# Patient Record
Sex: Male | Born: 1970 | Race: Black or African American | Hispanic: No | Marital: Married | Smoking: Never smoker
Health system: Southern US, Community
[De-identification: ages and names within clinical notes are randomized; demographics above are authoritative.]

## PROBLEM LIST (undated history)

## (undated) DIAGNOSIS — I10 Essential (primary) hypertension: Secondary | ICD-10-CM

## (undated) DIAGNOSIS — R42 Dizziness and giddiness: Secondary | ICD-10-CM

---

## 2012-08-13 ENCOUNTER — Ambulatory Visit: Payer: Self-pay | Admitting: Sports Medicine

## 2012-09-12 ENCOUNTER — Emergency Department (INDEPENDENT_AMBULATORY_CARE_PROVIDER_SITE_OTHER)
Admission: EM | Admit: 2012-09-12 | Discharge: 2012-09-12 | Disposition: A | Discharge status: Home / Self Care | Payer: Self-pay | Source: Home / Self Care | Attending: Family Medicine | Admitting: Family Medicine

## 2012-09-12 ENCOUNTER — Encounter: Payer: Self-pay | Admitting: Emergency Medicine

## 2012-09-12 ENCOUNTER — Inpatient Hospital Stay (HOSPITAL_BASED_OUTPATIENT_CLINIC_OR_DEPARTMENT_OTHER): Admit: 2012-09-12 | Payer: Self-pay

## 2012-09-12 ENCOUNTER — Telehealth: Payer: Self-pay | Admitting: *Deleted

## 2012-09-12 DIAGNOSIS — R51 Headache: Secondary | ICD-10-CM

## 2012-09-12 HISTORY — DX: Essential (primary) hypertension: I10

## 2012-09-12 MED ORDER — DEXAMETHASONE SODIUM PHOSPHATE 10 MG/ML IJ SOLN
10.0000 mg | Freq: Once | INTRAMUSCULAR | Status: AC
  Administered 2012-09-12: 10 mg via INTRAMUSCULAR

## 2012-09-12 MED ORDER — PROMETHAZINE HCL 25 MG PO TABS: 25.0000 mg | ORAL_TABLET | Freq: Four times a day (QID) | ORAL | Status: DC | PRN

## 2012-09-12 MED ORDER — AMOXICILLIN 875 MG PO TABS: 875.0000 mg | ORAL_TABLET | Freq: Two times a day (BID) | ORAL | Status: DC

## 2012-09-12 NOTE — ED Notes (Signed)
Reports headache x 5-6 days; tried Sudafed for past 2 days thinking it might be sinus congestion, but did not improve. History of photosensitivity. Last eye exam 18 months ago (today 20/20 without correction bilat).

## 2012-09-12 NOTE — ED Provider Notes (Signed)
CSN: [id number]     Arrival date & time 09/12/12  1226 History   First MD Initiated Contact with Patient 09/12/12 1234     Chief Complaint  Patient presents with  . Headache    HPI  Patient presents today with chief complaint of headache x1 week. Patient states she's noticed headache over the past 67 days. Pressure seems to be more so sinus related per patient. Is fairly constant and is worse with sneezing. Nonexertional. No hemiparesis or confusion. Patient has had some mild photophobia and nausea. Patient denies any personal history of migraines in the past however does report a family history of this. No change in vision. Headache does not have occipital radiation. No nuchal rigidity or stiffness. No fevers or chills. Past Medical History  Diagnosis Date  . Hypertension     borderline; no meds   History reviewed. No pertinent past surgical history. Family History  Problem Relation Age of Onset  . Hypertension Mother   . Hypertension Sister    History  Substance Use Topics  . Smoking status: Never Smoker   . Smokeless tobacco: Not on file  . Alcohol Use: Yes    Review of Systems  All other systems reviewed and are negative.    Allergies  Review of patient's allergies indicates no known allergies.  Home Medications  No current outpatient prescriptions on file. BP 113/80  Pulse 86  Temp(Src) 98 F (36.7 C) (Oral)  Resp 16  Ht 5\' 9"  (1.753 m)  Wt 179 lb (81.194 kg)  BMI 26.42 kg/m2  SpO2 97% Physical Exam  Constitutional: He is oriented to person, place, and time. He appears well-developed and well-nourished.  HENT:  Head: Normocephalic and atraumatic.  Right Ear: External ear normal.  Positive frontal and maxillary sinus tenderness to palpation.  Eyes: Conjunctivae are normal. Pupils are equal, round, and reactive to light.  Minimal photophobia  Neck: Normal range of motion. Neck supple.  No meningismus Kernig and Brudzinski negative  Cardiovascular:  Normal rate and regular rhythm.   Pulmonary/Chest: Effort normal.  Abdominal: Soft.  Musculoskeletal: Normal range of motion.  Neurological: He is alert and oriented to person, place, and time. No cranial nerve deficit.  Skin: Skin is warm.    ED Course  Procedures (including critical care time) Labs Review Labs Reviewed - No data to display Imaging Review No results found.  MDM   1. Headache(784.0)    Differential diagnosis for headache include sinusitis induced headache versus new onset migrainous headache. Patient does meet criteria for neuroimaging getting new onset headache after the age of 65 as well as having pain as greater than 10 out of 10 intermittently. Will send patient for head CT without contrast to further evaluate brain intracranial abnormalities. Decadron 10 mg IM x1. We'll prescribe amoxicillin as well as Phenergan. Discuss infectious neuro red flags patient at length. Followup as needed.     The patient and/or caregiver has been counseled thoroughly with regard to treatment plan and/or medications prescribed including dosage, schedule, interactions, rationale for use, and possible side effects and they verbalize understanding. Diagnoses and expected course of recovery discussed and will return if not improved as expected or if the condition worsens. Patient and/or caregiver verbalized understanding.         Doree Albee, MD 09/12/12 [phone]

## 2016-02-27 ENCOUNTER — Emergency Department: Payer: 59

## 2016-02-27 ENCOUNTER — Emergency Department (INDEPENDENT_AMBULATORY_CARE_PROVIDER_SITE_OTHER): Payer: 59

## 2016-02-27 ENCOUNTER — Encounter: Payer: Self-pay | Admitting: *Deleted

## 2016-02-27 ENCOUNTER — Emergency Department (INDEPENDENT_AMBULATORY_CARE_PROVIDER_SITE_OTHER)
Admission: EM | Admit: 2016-02-27 | Discharge: 2016-02-27 | Disposition: A | Discharge status: Home / Self Care | Payer: 59 | Source: Home / Self Care | Attending: Family Medicine | Admitting: Family Medicine

## 2016-02-27 DIAGNOSIS — R42 Dizziness and giddiness: Secondary | ICD-10-CM

## 2016-02-27 DIAGNOSIS — W19XXXA Unspecified fall, initial encounter: Secondary | ICD-10-CM

## 2016-02-27 DIAGNOSIS — M542 Cervicalgia: Secondary | ICD-10-CM | POA: Diagnosis not present

## 2016-02-27 DIAGNOSIS — M5489 Other dorsalgia: Secondary | ICD-10-CM | POA: Diagnosis not present

## 2016-02-27 DIAGNOSIS — M79602 Pain in left arm: Secondary | ICD-10-CM

## 2016-02-27 DIAGNOSIS — S0990XA Unspecified injury of head, initial encounter: Secondary | ICD-10-CM | POA: Diagnosis not present

## 2016-02-27 DIAGNOSIS — R55 Syncope and collapse: Secondary | ICD-10-CM

## 2016-02-27 DIAGNOSIS — R200 Anesthesia of skin: Secondary | ICD-10-CM

## 2016-02-27 DIAGNOSIS — M5412 Radiculopathy, cervical region: Secondary | ICD-10-CM | POA: Diagnosis not present

## 2016-02-27 DIAGNOSIS — M5136 Other intervertebral disc degeneration, lumbar region: Secondary | ICD-10-CM

## 2016-02-27 DIAGNOSIS — M5137 Other intervertebral disc degeneration, lumbosacral region: Secondary | ICD-10-CM | POA: Diagnosis not present

## 2016-02-27 DIAGNOSIS — M5020 Other cervical disc displacement, unspecified cervical region: Secondary | ICD-10-CM | POA: Diagnosis not present

## 2016-02-27 DIAGNOSIS — M5030 Other cervical disc degeneration, unspecified cervical region: Secondary | ICD-10-CM

## 2016-02-27 DIAGNOSIS — IMO0002 Reserved for concepts with insufficient information to code with codable children: Secondary | ICD-10-CM

## 2016-02-27 HISTORY — DX: Dizziness and giddiness: R42

## 2016-02-27 LAB — POCT CBC W AUTO DIFF (K'VILLE URGENT CARE)

## 2016-02-27 LAB — POCT FASTING CBG KUC MANUAL ENTRY: POCT Glucose (KUC): 86 mg/dL (ref 70–99)

## 2016-02-27 MED ORDER — ACETAMINOPHEN 325 MG PO TABS
650.0000 mg | ORAL_TABLET | Freq: Once | ORAL | Status: AC
  Administered 2016-02-27: 650 mg via ORAL

## 2016-02-27 MED ORDER — CYCLOBENZAPRINE HCL 5 MG PO TABS: 5.0000 mg | ORAL_TABLET | Freq: Two times a day (BID) | ORAL | 0 refills | Status: DC | PRN

## 2016-02-27 MED ORDER — ONDANSETRON 4 MG PO TBDP
4.0000 mg | ORAL_TABLET | Freq: Once | ORAL | Status: AC
  Administered 2016-02-27: 4 mg via ORAL

## 2016-02-27 NOTE — Discharge Instructions (Signed)
°  Flexeril is a muscle relaxer and may cause drowsiness. Do not drink alcohol, drive, or operate heavy machinery while taking. ° °

## 2016-02-27 NOTE — ED Provider Notes (Signed)
CSN: [id number]     Arrival date & time 02/27/16  1518 History   First MD Initiated Contact with Patient 02/27/16 1553     Chief Complaint  Patient presents with  . Dizziness  . Back Pain  . Shoulder Pain   (Consider location/radiation/quality/duration/timing/severity/associated sxs/prior Treatment) HPI  Sean Woods is a 46 y.o. male presenting to UC with wife with c/o Right lower back pain and Left shoulder pain with Left arm numbness after he passed out just PTA after an episode of vertigo.  Pt reports sustaining a concussion in July 2017 and has had intermittent episodes of vertigo since then. He is followed by his PCP for the concussion.  Pt notes he felt dizzy while brushing his teeth about 30 minutes ago, the next thing he recalls is seeing his family standing over him. His wife states he was out for just a few seconds and has been acting himself since the fall. Pt thinks he hit his shoulder on the bath tub. No prior hx of back or neck injuries or surgeries.    Past Medical History:  Diagnosis Date  . Hypertension    borderline; no meds  . Vertigo    History reviewed. No pertinent surgical history. Family History  Problem Relation Age of Onset  . Hypertension Mother   . Hypertension Sister    Social History  Substance Use Topics  . Smoking status: Never Smoker  . Smokeless tobacco: Never Used  . Alcohol use Yes    Review of Systems  Eyes: Negative for photophobia and visual disturbance.  Gastrointestinal: Positive for nausea. Negative for diarrhea and vomiting.  Musculoskeletal: Positive for back pain, myalgias and neck pain. Negative for arthralgias, gait problem, joint swelling and neck stiffness.  Skin: Negative for color change and wound.  Neurological: Positive for dizziness, syncope and numbness (Left arm). Negative for seizures and weakness.    Allergies  Patient has no known allergies.  Home Medications   Prior to Admission medications   Medication Sig  Start Date End Date Taking? Authorizing Provider  MECLIZINE HCL PO Take by mouth as needed.   Yes Historical Provider, MD  ondansetron (ZOFRAN-ODT) 4 MG disintegrating tablet Take 4 mg by mouth every 8 (eight) hours as needed for nausea or vomiting.   Yes Historical Provider, MD  cyclobenzaprine (FLEXERIL) 5 MG tablet Take 1-2 tablets (5-10 mg total) by mouth 2 (two) times daily as needed for muscle spasms. 02/27/16   Junius Finner, PA-C   Meds Ordered and Administered this Visit   Medications  ondansetron (ZOFRAN-ODT) disintegrating tablet 4 mg (4 mg Oral Given 02/27/16 1557)  acetaminophen (TYLENOL) tablet 650 mg (650 mg Oral Given 02/27/16 1557)    BP 148/97 (BP Location: Left Arm)   Pulse 78   Wt 185 lb (83.9 kg)   SpO2 99%   BMI 27.32 kg/m  No data found.   Physical Exam  Constitutional: He is oriented to person, place, and time. He appears well-developed and well-nourished. No distress.  HENT:  Head: Normocephalic and atraumatic.  Right Ear: Tympanic membrane normal.  Left Ear: Tympanic membrane normal.  Nose: Nose normal.  Mouth/Throat: Uvula is midline, oropharynx is clear and moist and mucous membranes are normal.  Eyes: Conjunctivae are normal. Pupils are equal, round, and reactive to light. Right conjunctiva has no hemorrhage. Right eye exhibits nystagmus. Left eye exhibits nystagmus.  Slight horizontal nystagmus   Neck: Normal range of motion. Neck supple.  Left side cervical muscle tenderness  Cardiovascular:  Normal rate and regular rhythm.   Pulmonary/Chest: Effort normal. No stridor. No respiratory distress. He has no wheezes. He has no rales.  Musculoskeletal: Normal range of motion. He exhibits tenderness.  Tenderness to Left upper back and Left shoulder. Full ROM. Pt reports numbness and tingling in Left arm with movement. Tenderness to lower back, worse on Right side.   Lymphadenopathy:    He has no cervical adenopathy.  Neurological: He is alert and oriented  to person, place, and time.  Reflex Scores:      Patellar reflexes are 2+ on the right side and 2+ on the left side. Alert to person place and time. Speech is clear. Mildly antalgic gait.  Skin: Skin is warm and dry. He is not diaphoretic.  Psychiatric: He has a normal mood and affect. His behavior is normal.  Nursing note and vitals reviewed.   Urgent Care Course     Procedures (including critical care time)  Labs Review Labs Reviewed  BASIC METABOLIC PANEL  POCT FASTING CBG [facility] MANUAL ENTRY  POCT CBC W AUTO DIFF ([facility])    Imaging Review Dg Lumbar Spine Complete  Result Date: 02/27/2016 CLINICAL DATA:  Low back pain post fall today. EXAM: LUMBAR SPINE - COMPLETE 4+ VIEW COMPARISON:  None. FINDINGS: There is no evidence of lumbar spine fracture. Alignment is normal. Degenerative disc disease at L5-S1 with associated posterior facet arthropathy IMPRESSION: No evidence of acute fracture. L5-S1 degenerative disc disease. Electronically Signed   By: Ted Mcalpineobrinka  Dimitrova M.D.   On: 02/27/2016 17:02   Ct Head Wo Contrast  Result Date: 02/27/2016 CLINICAL DATA:  Syncopal episode.  Patient fell and hit head. EXAM: CT HEAD WITHOUT CONTRAST CT CERVICAL SPINE WITHOUT CONTRAST TECHNIQUE: Multidetector CT imaging of the head and cervical spine was performed following the standard protocol without intravenous contrast. Multiplanar CT image reconstructions of the cervical spine were also generated. COMPARISON:  None. FINDINGS: CT HEAD FINDINGS Brain: No evidence of acute infarction, hemorrhage, hydrocephalus, extra-axial collection or mass lesion/mass effect. Vascular: No hyperdense vessel or unexpected calcification. Skull: Normal. Negative for fracture or focal lesion. Sinuses/Orbits: Visualize globes and orbits unremarkable. Visualized sinuses and mastoid air cells are clear. Other: None. CT CERVICAL SPINE FINDINGS Alignment: Normal. Skull base and vertebrae: No acute fracture. No  primary bone lesion or focal pathologic process. Soft tissues and spinal canal: No prevertebral fluid or swelling. No visible canal hematoma. Disc levels: Mild loss of disc height with spondylotic disc bulging and endplate spurring at C4-C5 and C6-C7. No disc herniations. The central spinal canal and neural foramina are well preserved. Upper chest: No acute findings. Other: None. IMPRESSION: HEAD CT:  Normal.  No skull fracture. CERVICAL CT:  No fracture or acute finding. Electronically Signed   By: Amie Portlandavid  Ormond M.D.   On: 02/27/2016 16:50   Ct Cervical Spine Wo Contrast  Result Date: 02/27/2016 CLINICAL DATA:  Syncopal episode.  Patient fell and hit head. EXAM: CT HEAD WITHOUT CONTRAST CT CERVICAL SPINE WITHOUT CONTRAST TECHNIQUE: Multidetector CT imaging of the head and cervical spine was performed following the standard protocol without intravenous contrast. Multiplanar CT image reconstructions of the cervical spine were also generated. COMPARISON:  None. FINDINGS: CT HEAD FINDINGS Brain: No evidence of acute infarction, hemorrhage, hydrocephalus, extra-axial collection or mass lesion/mass effect. Vascular: No hyperdense vessel or unexpected calcification. Skull: Normal. Negative for fracture or focal lesion. Sinuses/Orbits: Visualize globes and orbits unremarkable. Visualized sinuses and mastoid air cells are clear. Other: None. CT CERVICAL  SPINE FINDINGS Alignment: Normal. Skull base and vertebrae: No acute fracture. No primary bone lesion or focal pathologic process. Soft tissues and spinal canal: No prevertebral fluid or swelling. No visible canal hematoma. Disc levels: Mild loss of disc height with spondylotic disc bulging and endplate spurring at C4-C5 and C6-C7. No disc herniations. The central spinal canal and neural foramina are well preserved. Upper chest: No acute findings. Other: None. IMPRESSION: HEAD CT:  Normal.  No skull fracture. CERVICAL CT:  No fracture or acute finding. Electronically  Signed   By: Amie Portland M.D.   On: 02/27/2016 16:50     MDM   1. Fall, initial encounter   2. Passed out   3. Head trauma   4. Left cervical radiculopathy   5. Degenerative disc disease, lumbar   6. Bulging of cervical intervertebral disc   7. Vertigo    Pt c/o dizziness similar to prior episodes of vertigo. Pain after fall from passing out.  Plain films lumbar spine: no acute findings.  Imaging c/w DDD in lumbar spine  CT head and cervical spine: no acute findings. Mild disc bulging at C4-C5 and C6-C7 with spurring.   Reassured pt of no acute findings.  Pain likely due to muscle spasm and strain.  Rx: Flexeril  F/u with PCP or Sports Medicine in 1-2 weeks as needed.     Junius Finner, PA-C 02/27/16 [phone]

## 2016-02-27 NOTE — ED Triage Notes (Signed)
Patient c/o dizziness and nausea today. While brushing his teeth about 30 minutes ago felt dizziness recur then awoke with his family over him. C/o low back and left shoulder pain. H/o vertigo.

## 2016-02-28 ENCOUNTER — Telehealth: Payer: Self-pay | Admitting: *Deleted

## 2016-02-28 LAB — BASIC METABOLIC PANEL
BUN: 12 mg/dL (ref 7–25)
CO2: 26 mmol/L (ref 20–31)
Calcium: 9.6 mg/dL (ref 8.6–10.3)
Chloride: 104 mmol/L (ref 98–110)
Creat: 1.11 mg/dL (ref 0.60–1.35)
Glucose, Bld: 97 mg/dL (ref 65–99)
Potassium: 4.4 mmol/L (ref 3.5–5.3)
Sodium: 140 mmol/L (ref 135–146)

## 2016-02-28 NOTE — Telephone Encounter (Signed)
Spoke to pt given lab results. To call back if he has any questions or concerns.

## 2016-03-14 ENCOUNTER — Encounter: Payer: Self-pay | Admitting: Sports Medicine

## 2016-03-14 ENCOUNTER — Ambulatory Visit (INDEPENDENT_AMBULATORY_CARE_PROVIDER_SITE_OTHER): Payer: 59 | Admitting: Sports Medicine

## 2016-03-14 DIAGNOSIS — M5136 Other intervertebral disc degeneration, lumbar region: Secondary | ICD-10-CM

## 2016-03-14 DIAGNOSIS — M51369 Other intervertebral disc degeneration, lumbar region without mention of lumbar back pain or lower extremity pain: Secondary | ICD-10-CM | POA: Insufficient documentation

## 2016-03-14 MED ORDER — MELOXICAM 15 MG PO TABS: ORAL_TABLET | ORAL | 3 refills | Status: DC

## 2016-03-14 MED ORDER — PREDNISONE 50 MG PO TABS: ORAL_TABLET | ORAL | 0 refills | Status: DC

## 2016-03-14 NOTE — Assessment & Plan Note (Signed)
Persistent axial pain. Pain is localized over the SI joints, consistent with SI joint pain, also question degenerative disc disease with x-rays. Prednisone, meloxicam, formal physical therapy. Return to see me in one month, MRI for interventional planning if no better. If pain continues to be over the SI joints we will inject them under ultrasound guidance.

## 2016-03-14 NOTE — Progress Notes (Signed)
   Subjective:    I'm seeing this patient as a consultation for:  Junius FinnerErin O'Malley PA-C  CC: Back pain  HPI: A little over 2 weeks ago this pleasant 46 year old male with vertigo lost his balance and fell onto his back, impacting it against the time. Since then he's had persistent pain that he localizes at both SI joints, nothing radicular, no bowel or bladder distention, saddle numbness, constitutional symptoms.  Past medical history:  Negative.  See flowsheet/record as well for more information.  Surgical history: Negative.  See flowsheet/record as well for more information.  Family history: Negative.  See flowsheet/record as well for more information.  Social history: Negative.  See flowsheet/record as well for more information.  Allergies, and medications have been entered into the medical record, reviewed, and no changes needed.   Review of Systems: No headache, visual changes, nausea, vomiting, diarrhea, constipation, dizziness, abdominal pain, skin rash, fevers, chills, night sweats, weight loss, swollen lymph nodes, body aches, joint swelling, muscle aches, chest pain, shortness of breath, mood changes, visual or auditory hallucinations.   Objective:   General: Well Developed, well nourished, and in no acute distress.  Neuro/Psych: Alert and oriented x3, extra-ocular muscles intact, able to move all 4 extremities, sensation grossly intact. Skin: Warm and dry, no rashes noted.  Respiratory: Not using accessory muscles, speaking in full sentences, trachea midline.  Cardiovascular: Pulses palpable, no extremity edema. Abdomen: Does not appear distended. Back Exam:  Inspection: Unremarkable  Motion: Flexion 45 deg, Extension 45 deg, Side Bending to 45 deg bilaterally,  Rotation to 45 deg bilaterally  SLR laying: Negative  XSLR laying: Negative  Palpable tenderness: Left and right SI joints. FABER: negative. Sensory change: Gross sensation intact to all lumbar and sacral dermatomes.    Reflexes: 2+ at both patellar tendons, 2+ at achilles tendons, Babinski's downgoing.  Strength at foot  Plantar-flexion: 5/5 Dorsi-flexion: 5/5 Eversion: 5/5 Inversion: 5/5  Leg strength  Quad: 5/5 Hamstring: 5/5 Hip flexor: 5/5 Hip abductors: 5/5  Gait unremarkable.  Lumbar spine x-rays reviewed, there is severe L5-S1 degenerative disc disease, he also has multilevel degenerative changes in his cervical spine CT  Impression and Recommendations:   This case required medical decision making of moderate complexity.  Lumbar degenerative disc disease Persistent axial pain. Pain is localized over the SI joints, consistent with SI joint pain, also question degenerative disc disease with x-rays. Prednisone, meloxicam, formal physical therapy. Return to see me in one month, MRI for interventional planning if no better. If pain continues to be over the SI joints we will inject them under ultrasound guidance.

## 2017-01-23 ENCOUNTER — Other Ambulatory Visit: Payer: Self-pay

## 2017-01-23 ENCOUNTER — Emergency Department (INDEPENDENT_AMBULATORY_CARE_PROVIDER_SITE_OTHER)
Admission: EM | Admit: 2017-01-23 | Discharge: 2017-01-23 | Disposition: A | Discharge status: Home / Self Care | Payer: BLUE CROSS/BLUE SHIELD | Source: Home / Self Care | Attending: Family Medicine | Admitting: Family Medicine

## 2017-01-23 DIAGNOSIS — R51 Headache: Secondary | ICD-10-CM

## 2017-01-23 DIAGNOSIS — R519 Headache, unspecified: Secondary | ICD-10-CM

## 2017-01-23 MED ORDER — PREDNISONE 20 MG PO TABS: ORAL_TABLET | ORAL | 0 refills | Status: DC

## 2017-01-23 MED ORDER — NAPROXEN 500 MG PO TABS: 500.0000 mg | ORAL_TABLET | Freq: Two times a day (BID) | ORAL | 0 refills | Status: DC

## 2017-01-23 MED ORDER — METHOCARBAMOL 500 MG PO TABS: 500.0000 mg | ORAL_TABLET | Freq: Two times a day (BID) | ORAL | 0 refills | Status: DC

## 2017-01-23 NOTE — ED Triage Notes (Signed)
Pt stated that he has a headache on the left side.  It has been going on for about 9 days now.  He states that it is in the left temporal area, left eye, and left forehead.  Has not taken OTC meds the past 4-5 days.  Denies light sensitivity, and denies visual problems.

## 2017-01-23 NOTE — ED Provider Notes (Signed)
[facility]    CSN: [id number] Arrival date & time: 01/23/17  1112     History   Chief Complaint Chief Complaint  Patient presents with  . Headache    HPI Sean Woods is a 47 y.o. male.   HPI Sean Woods is a 47 y.o. male presenting to UC with c/o persistent Left sided HA that started about 9 days ago.  Pain is worse at cornea of Left eye but radiates along Left side of head to the back of his head.  Pain is aching, occasional sharp pain.  Pain is 7/10.  No relief with ibuprofen 800mg  taken 4-5 days ago so he has not tried additional OTC pain medication. He does report completing a course of amoxicillin yesterday, prescribed for a sinus infection and ear infection.  Those symptoms of congestion and ear pain have resolved.  He was seen at a different UC this past weekend for HA.  He was given Toradol, Reglan and Decadron with relief for that day but HA was back the next day.  He also reports getting a CT scan that was normal. Denies change in vision. Denies n/v/d. No hx of migraines or recent head trauma.    Past Medical History:  Diagnosis Date  . Hypertension    borderline; no meds  . Vertigo     Patient Active Problem List   Diagnosis Date Noted  . Lumbar degenerative disc disease 03/14/2016    History reviewed. No pertinent surgical history.     Home Medications    Prior to Admission medications   Medication Sig Start Date End Date Taking? Authorizing Provider  cyclobenzaprine (FLEXERIL) 5 MG tablet Take 1-2 tablets (5-10 mg total) by mouth 2 (two) times daily as needed for muscle spasms. 02/27/16   Lurene Shadow, PA-C  MECLIZINE HCL PO Take by mouth as needed.    [provider]  meloxicam (MOBIC) 15 MG tablet One tab PO qAM with breakfast for 2 weeks, then daily prn pain. 03/14/16   Monica Becton, MD  methocarbamol (ROBAXIN) 500 MG tablet Take 1 tablet (500 mg total) by mouth 2 (two) times daily. 01/23/17   Lurene Shadow, PA-C    naproxen (NAPROSYN) 500 MG tablet Take 1 tablet (500 mg total) by mouth 2 (two) times daily. 01/23/17   Lurene Shadow, PA-C  ondansetron (ZOFRAN-ODT) 4 MG disintegrating tablet Take 4 mg by mouth every 8 (eight) hours as needed for nausea or vomiting.    [provider]  PARoxetine (PAXIL) 10 MG tablet  01/18/16   [provider]  predniSONE (DELTASONE) 20 MG tablet 3 tabs po day one, then 2 po daily x 4 days 01/23/17   Lurene Shadow, PA-C  testosterone cypionate (DEPOTESTOSTERONE CYPIONATE) 200 MG/ML injection  02/27/16   [provider]    Family History Family History  Problem Relation Age of Onset  . Hypertension Mother   . Hypertension Sister     Social History Social History   Tobacco Use  . Smoking status: Never Smoker  . Smokeless tobacco: Never Used  Substance Use Topics  . Alcohol use: Yes  . Drug use: No     Allergies   Patient has no known allergies.   Review of Systems Review of Systems  Constitutional: Negative for chills, diaphoresis and fever.  Eyes: Negative for photophobia, pain and visual disturbance.  Gastrointestinal: Negative for nausea and vomiting.  Musculoskeletal: Negative for neck pain and neck stiffness.  Skin: Negative  for rash.  Neurological: Positive for headaches. Negative for dizziness, syncope, facial asymmetry, weakness, light-headedness and numbness.     Physical Exam Triage Vital Signs ED Triage Vitals  Enc Vitals Group     BP 01/23/17 1153 134/88     Pulse Rate 01/23/17 1153 69     Resp --      Temp 01/23/17 1153 97.9 F (36.6 C)     Temp Source 01/23/17 1153 Oral     SpO2 01/23/17 1153 99 %     Weight 01/23/17 1154 181 lb (82.1 kg)     Height 01/23/17 1154 5\' 9"  (1.753 m)     Head Circumference --      Peak Flow --      Pain Score 01/23/17 1154 7     Pain Loc --      Pain Edu? --      Excl. in GC? --    No data found.  Updated Vital Signs BP 134/88 (BP Location: Right Arm)   Pulse 69    Temp 97.9 F (36.6 C) (Oral)   Ht 5\' 9"  (1.753 m)   Wt 181 lb (82.1 kg)   SpO2 99%   BMI 26.73 kg/m   Visual Acuity Right Eye Distance:   Left Eye Distance:   Bilateral Distance:    Right Eye Near:   Left Eye Near:    Bilateral Near:     Physical Exam  Constitutional: He is oriented to person, place, and time. He appears well-developed and well-nourished.  Non-toxic appearance. He does not appear ill. No distress.  HENT:  Head: Normocephalic and atraumatic.    Right Ear: Tympanic membrane normal.  Left Ear: Tympanic membrane normal.  Nose: Nose normal. Right sinus exhibits no maxillary sinus tenderness and no frontal sinus tenderness. Left sinus exhibits no maxillary sinus tenderness and no frontal sinus tenderness.  Mouth/Throat: Uvula is midline, oropharynx is clear and moist and mucous membranes are normal.  Mild tenderness to lateral canthus of Left eye. No edema or erythema. No crepitus. No tenderness over temporal artery.   Eyes: EOM are normal.  Neck: Normal range of motion.  Cardiovascular: Normal rate.  Pulmonary/Chest: Effort normal.  Musculoskeletal: Normal range of motion.  Neurological: He is alert and oriented to person, place, and time. He has normal strength. He is not disoriented. Gait normal. GCS eye subscore is 4. GCS verbal subscore is 5. GCS motor subscore is 6.  CN II-XII in tact. Speech is clear. Alert to person, place and time. Normal coordination. Normal gait.  Skin: Skin is warm and dry.  Psychiatric: He has a normal mood and affect. His behavior is normal.  Nursing note and vitals reviewed.    UC Treatments / Results  Labs (all labs ordered are listed, but only abnormal results are displayed) Labs Reviewed - No data to display  EKG  EKG Interpretation None       Radiology No results found.  Procedures Procedures (including critical care time)  Medications Ordered in UC Medications - No data to display   Initial Impression /  Assessment and Plan / UC Course  I have reviewed the triage vital signs and the nursing notes.  Pertinent labs & imaging results that were available during my care of the patient were reviewed by me and considered in my medical decision making (see chart for details).     Left sided HA. Normal neuro exam. Doubt SAH or CVT  Pt declined another migraine cocktail. No indication  to repeat imaging at this time. F/u with PCP or HA specialist next week if not improving. Discussed symptoms that warrant emergent care in the ED.   Final Clinical Impressions(s) / UC Diagnoses   Final diagnoses:  Left-sided headache    ED Discharge Orders        Ordered    predniSONE (DELTASONE) 20 MG tablet     01/23/17 1214    methocarbamol (ROBAXIN) 500 MG tablet  2 times daily     01/23/17 1214    naproxen (NAPROSYN) 500 MG tablet  2 times daily     01/23/17 1214       Controlled Substance Prescriptions  Controlled Substance Registry consulted? Not Applicable   Rolla Platehelps, Hamza Empson O, PA-C 01/23/17 1513

## 2017-01-23 NOTE — Discharge Instructions (Signed)
°  Robaxin (methocarbamol) is a muscle relaxer and may cause drowsiness. Do not drink alcohol, drive, or operate heavy machinery while taking.  You may take 500mg  acetaminophen every 4-6 hours or in combination with prescribed Naproxen every 12 hours as needed for pain and inflammation.

## 2017-11-18 ENCOUNTER — Emergency Department (INDEPENDENT_AMBULATORY_CARE_PROVIDER_SITE_OTHER)
Admission: EM | Admit: 2017-11-18 | Discharge: 2017-11-18 | Disposition: A | Discharge status: Home / Self Care | Payer: BLUE CROSS/BLUE SHIELD | Source: Home / Self Care | Attending: Family Medicine | Admitting: Family Medicine

## 2017-11-18 ENCOUNTER — Emergency Department (INDEPENDENT_AMBULATORY_CARE_PROVIDER_SITE_OTHER): Payer: BLUE CROSS/BLUE SHIELD

## 2017-11-18 ENCOUNTER — Other Ambulatory Visit: Payer: Self-pay

## 2017-11-18 DIAGNOSIS — R030 Elevated blood-pressure reading, without diagnosis of hypertension: Secondary | ICD-10-CM

## 2017-11-18 DIAGNOSIS — R0602 Shortness of breath: Secondary | ICD-10-CM | POA: Diagnosis not present

## 2017-11-18 DIAGNOSIS — R079 Chest pain, unspecified: Secondary | ICD-10-CM | POA: Diagnosis not present

## 2017-11-18 DIAGNOSIS — Z566 Other physical and mental strain related to work: Secondary | ICD-10-CM

## 2017-11-18 NOTE — Discharge Instructions (Signed)
°  Your EKG and chest x-ray were normal today, which is reassuring.  It is still recommended that you establish care with a primary care provider for ongoing healthcare needs and recheck of symptoms later this week or next week if not improving.  Call 911 or go to the hospital if new or worsening symptoms develop- worsening chest pain, trouble breathing, dizziness, passing out.

## 2017-11-18 NOTE — ED Triage Notes (Signed)
Pt started having chest pain last night around 11 pm.  It was in the center of chest radiating into shoulder area,  This morning still having chest pain, that is radiating up into left jaw.

## 2017-11-18 NOTE — ED Provider Notes (Signed)
[facility]    CSN: [id number] Arrival date & time: 11/18/17  0957     History   Chief Complaint Chief Complaint  Patient presents with  . Chest Pain    HPI Sean Woods is a 47 y.o. male.   HPI  Sean Woods is a 47 y.o. male presenting to UC with c/o Left sided chest pain that started last night just before going to bed. Pain is aching and sharp at times, constant since onset around 11PM, waxing and waning severity, radiating into jaw and down Left arm. Pain is 6/10 at this time but was 8/10 last night. Denies dizziness or nausea. Denies cough, congestion, or SOB.  He did have some sweats and mild nausea and dizziness last night with the onset of symptoms, that lasted about 10-15 minutes.  No prior hx of similar symptoms or hx of CAD. No family hx of CAD.  Pt notes he is stressed at work and commutes 2 hours to and from work in [place] every day. He also reports getting only 2-3 solid hours of sleep each night for as long as he can recall. His wife also believes the symptoms started last night in relation to his stress at work.  He does have a PCP, last annual physical was about 1.5 years ago.  He denies hx of HTN but medical records show a hx of borderline HTN.  He is not on any daily medication.     Past Medical History:  Diagnosis Date  . Hypertension    borderline; no meds  . Vertigo     Patient Active Problem List   Diagnosis Date Noted  . Lumbar degenerative disc disease 03/14/2016    History reviewed. No pertinent surgical history.     Home Medications    Prior to Admission medications   Medication Sig Start Date End Date Taking? Authorizing Provider  cyclobenzaprine (FLEXERIL) 5 MG tablet Take 1-2 tablets (5-10 mg total) by mouth 2 (two) times daily as needed for muscle spasms. 02/27/16   Lurene Shadow, PA-C  MECLIZINE HCL PO Take by mouth as needed.    [provider]  meloxicam (MOBIC) 15 MG tablet One tab PO qAM with breakfast for 2  weeks, then daily prn pain. 03/14/16   Monica Becton, MD  methocarbamol (ROBAXIN) 500 MG tablet Take 1 tablet (500 mg total) by mouth 2 (two) times daily. 01/23/17   Lurene Shadow, PA-C  naproxen (NAPROSYN) 500 MG tablet Take 1 tablet (500 mg total) by mouth 2 (two) times daily. 01/23/17   Lurene Shadow, PA-C  ondansetron (ZOFRAN-ODT) 4 MG disintegrating tablet Take 4 mg by mouth every 8 (eight) hours as needed for nausea or vomiting.    [provider]  PARoxetine (PAXIL) 10 MG tablet  01/18/16   [provider]  predniSONE (DELTASONE) 20 MG tablet 3 tabs po day one, then 2 po daily x 4 days 01/23/17   Lurene Shadow, PA-C  testosterone cypionate (DEPOTESTOSTERONE CYPIONATE) 200 MG/ML injection  02/27/16   [provider]    Family History Family History  Problem Relation Age of Onset  . Hypertension Mother   . Hypertension Sister     Social History Social History   Tobacco Use  . Smoking status: Never Smoker  . Smokeless tobacco: Never Used  Substance Use Topics  . Alcohol use: Yes  . Drug use: No     Allergies   Patient has no known allergies.  Review of Systems Review of Systems  Constitutional: Positive for diaphoresis. Negative for chills and fever.  HENT: Negative for congestion and sore throat.   Respiratory: Negative for cough, chest tightness and shortness of breath.   Cardiovascular: Positive for chest pain. Negative for palpitations.  Gastrointestinal: Positive for nausea. Negative for abdominal pain, diarrhea and vomiting.     Physical Exam Triage Vital Signs ED Triage Vitals [11/18/17 1016]  Enc Vitals Group     BP (!) 147/99     Pulse Rate 68     Resp      Temp 98.3 F (36.8 C)     Temp Source Oral     SpO2 98 %     Weight      Height      Head Circumference      Peak Flow      Pain Score 6     Pain Loc      Pain Edu?      Excl. in GC?    Orthostatic VS for the past 24 hrs:  BP- Lying Pulse- Lying BP-  Sitting Pulse- Sitting BP- Standing at 0 minutes Pulse- Standing at 0 minutes  11/18/17 1104 (!) 151/104 63 (!) 154/93 65 (!) 146/96 64    Updated Vital Signs BP (!) 147/99 (BP Location: Right Arm)   Pulse 68   Temp 98.3 F (36.8 C) (Oral)   SpO2 98%   Visual Acuity Right Eye Distance:   Left Eye Distance:   Bilateral Distance:    Right Eye Near:   Left Eye Near:    Bilateral Near:     Physical Exam  Constitutional: He is oriented to person, place, and time. He appears well-developed and well-nourished.  Pt sitting on exam bed, NAD  HENT:  Head: Normocephalic and atraumatic.  Eyes: EOM are normal.  Neck: Normal range of motion. Neck supple.  Cardiovascular: Normal rate and regular rhythm.  Pulmonary/Chest: Effort normal and breath sounds normal. No respiratory distress. He has no decreased breath sounds. He has no wheezes. He has no rhonchi. He has no rales. He exhibits no tenderness.  Musculoskeletal: Normal range of motion.  Neurological: He is alert and oriented to person, place, and time.  Skin: Skin is warm and dry.  Psychiatric: He has a normal mood and affect. His behavior is normal.  Nursing note and vitals reviewed.    UC Treatments / Results  Labs (all labs ordered are listed, but only abnormal results are displayed) Labs Reviewed - No data to display  EKG Normal Sinus Rhythm. See scanned EKG  Radiology Dg Chest 2 View  Result Date: 11/18/2017 CLINICAL DATA:  Left side chest pain, shortness of breath EXAM: CHEST - 2 VIEW COMPARISON:  None. FINDINGS: Heart and mediastinal contours are within normal limits. No focal opacities or effusions. No acute bony abnormality. IMPRESSION: No active cardiopulmonary disease. Electronically Signed   By: Charlett Nose M.D.   On: 11/18/2017 10:39    Procedures Procedures (including critical care time)  Medications Ordered in UC Medications - No data to display  Initial Impression / Assessment and Plan / UC Course  I  have reviewed the triage vital signs and the nursing notes.  Pertinent labs & imaging results that were available during my care of the patient were reviewed by me and considered in my medical decision making (see chart for details).     BP slightly elevated but otherwise normal exam, normal EKG and CXR Reassured pt of normal  workup in UC Encouraged f/u with PCP for recheck of symptoms and monitoring of BP Encouraged to discuss work stress and sleep difficulties with his PCP Wife is able to have him seen on Friday for a recheck.  Discussed symptoms that warrant emergent care in the ED.  Final Clinical Impressions(s) / UC Diagnoses   Final diagnoses:  Left-sided chest pain  Nonspecific chest pain  Elevated blood pressure reading     Discharge Instructions      Your EKG and chest x-ray were normal today, which is reassuring.  It is still recommended that you establish care with a primary care provider for ongoing healthcare needs and recheck of symptoms later this week or next week if not improving.  Call 911 or go to the hospital if new or worsening symptoms develop- worsening chest pain, trouble breathing, dizziness, passing out.    ED Prescriptions    None     Controlled Substance Prescriptions [place] Controlled Substance Registry consulted? Not Applicable   Rolla Platehelps, Harshitha Fretz O, PA-C 11/18/17 1215

## 2017-11-19 ENCOUNTER — Telehealth: Payer: Self-pay | Admitting: *Deleted

## 2017-11-19 NOTE — Telephone Encounter (Signed)
Spoke to pt he reports that he is feeling some better today. He saw his PCP today and had BW done. PCP is sch'ing him an appt with cardiology.

## 2018-02-02 ENCOUNTER — Ambulatory Visit (INDEPENDENT_AMBULATORY_CARE_PROVIDER_SITE_OTHER): Payer: BLUE CROSS/BLUE SHIELD | Admitting: Pulmonary Disease

## 2018-02-02 ENCOUNTER — Encounter: Payer: Self-pay | Admitting: Pulmonary Disease

## 2018-02-02 VITALS — BP 138/90 | HR 77 | Ht 69.0 in | Wt 160.4 lb

## 2018-02-02 DIAGNOSIS — R079 Chest pain, unspecified: Secondary | ICD-10-CM | POA: Diagnosis not present

## 2018-02-02 DIAGNOSIS — R0602 Shortness of breath: Secondary | ICD-10-CM

## 2018-02-02 DIAGNOSIS — G4733 Obstructive sleep apnea (adult) (pediatric): Secondary | ICD-10-CM | POA: Diagnosis not present

## 2018-02-02 DIAGNOSIS — Z9989 Dependence on other enabling machines and devices: Secondary | ICD-10-CM

## 2018-02-02 NOTE — Patient Instructions (Signed)
  Lung function appears normal Call me back with results of EGD & we an schedule CT scan if negative

## 2018-02-02 NOTE — Assessment & Plan Note (Signed)
Continue CPAP for now Obtain download from [facility]

## 2018-02-02 NOTE — Progress Notes (Signed)
Subjective:    Patient ID: Sean Woods, male    DOB: August 15, 1970, 48 y.o.   MRN: [id number]  HPI  48 year old never smoker presents for evaluation of chest pain. He reports substernal burning chest pain of sudden onset that woke him up from his sleep in mid November.  He had an urgent care visit, chest x-ray was normal as well as EKG.  I personally reviewed this x-ray does not show any infiltrates or effusions. Since then pain has persisted, has not been related but seems to occur at all times of the day, independent of exertion.  He has seen GI and cardiology for this.  Treadmill stress test was normal and he was cleared from cardiology standpoint. He was started on empiric Protonix which she has taken for the last 2 weeks but this is not providing him relief.  An [facility] is scheduled for [facility] and current as well. 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 He also reports dyspnea especially when he has pains.  He was placed on Breo.  He denies wheezing, seasonal allergies or postnasal drip. PFTs were scheduled but then canceled due to insurance issues.  He is a lifetime never smoker and denies childhood history of asthma  He was diagnosed with OSA in 05/2017 by home sleep study done by [facility] and although study only showed mild sleep apnea, it was felt that the symptoms were more severe.  He was placed on CPAP which she has tolerated well and is compliant with, DME's [facility] but he does not feel refreshed on waking up and remains tired with low energy throughout the day.  His blood pressure is also increased over the past few months and he is required to medications, amlodipine and HCTZ   Significant tests/ events reviewed  HST 05/16/17 which showed an RDI +15 with percent time Sp)2 below 90%: 2.4%, minimum SpO2 of 76.1%  Spirometry today was normal with ratio of 86, FEV1 87% and FVC of 81%    Past Medical History:    Diagnosis Date  . Hypertension    borderline; no meds  . Vertigo     No past surgical history on file.  No Known Allergies  Social History   Socioeconomic History  . Marital status: Married    Spouse name: Not on file  . Number of children: Not on file  . Years of education: Not on file  . Highest education level: Not on file  Occupational History  . Not on file  Social Needs  . Financial resource strain: Not on file  . Food insecurity:    Worry: Not on file    Inability: Not on file  . Transportation needs:    Medical: Not on file    Non-medical: Not on file  Tobacco Use  . Smoking status: Never Smoker  . Smokeless tobacco: Never Used  Substance and Sexual Activity  . Alcohol use: Yes  . Drug use: No  . Sexual activity: Not on file  Lifestyle  . Physical activity:    Days per week: Not on file    Minutes per session: Not on file  . Stress: Not on file  Relationships  . Social connections:    Talks on phone: Not on file    Gets together: Not on file    Attends religious service: Not on file    Active member of club or organization: Not on file  Attends meetings of clubs or organizations: Not on file    Relationship status: Not on file  . Intimate partner violence:    Fear of current or ex partner: Not on file    Emotionally abused: Not on file    Physically abused: Not on file    Forced sexual activity: Not on file  Other Topics Concern  . Not on file  Social History Narrative  . Not on file     Family History  Problem Relation Age of Onset  . Hypertension Mother   . Hypertension Sister     Review of Systems Constitutional: negative for anorexia, fevers and sweats  Eyes: negative for irritation, redness and visual disturbance  Ears, nose, mouth, throat, and face: negative for earaches, epistaxis, nasal congestion and sore throat  Respiratory: negative for cough,  sputum and wheezing  Cardiovascular: negative for  lower extremity edema,  orthopnea, palpitations and syncope  Gastrointestinal: negative for abdominal pain, constipation, diarrhea, melena, nausea and vomiting  Genitourinary:negative for dysuria, frequency and hematuria  Hematologic/lymphatic: negative for bleeding, easy bruising and lymphadenopathy  Musculoskeletal:negative for arthralgias, muscle weakness and stiff joints  Neurological: negative for coordination problems, gait problems, headaches and weakness  Endocrine: negative for diabetic symptoms including polydipsia, polyuria and weight loss     Objective:   Physical Exam  Gen. Pleasant, well-nourished, in no distress, normal affect ENT - no pallor,icterus, no post nasal drip Neck: No JVD, no thyromegaly, no carotid bruits Lungs: no use of accessory muscles, no dullness to percussion, clear without rales or rhonchi  Cardiovascular: Rhythm regular, heart sounds  normal, no murmurs or gallops, no peripheral edema Abdomen: soft and non-tender, no hepatosplenomegaly, BS normal. Musculoskeletal: No deformities, no cyanosis or clubbing, good pulses Neuro:  alert, non focal       Assessment & Plan:

## 2018-02-02 NOTE — Assessment & Plan Note (Signed)
No etiology identified at this point.  Does not appear to be ischemic or pericarditis.  Does not appear to be pleuritic or other pulmonary cause.  The burning seems to indicate reflux however no relief from PPI so far.  Upper endoscopy is planned today and hopefully this will reveal a cause. If not they will call me back and we will proceed with CT angiogram of the chest.  No reason to consider dissection here and no risk factors for pulmonary bothersome

## 2018-02-02 NOTE — Progress Notes (Signed)
   Subjective:    Patient ID: Sean Woods, male    DOB: 27-Aug-1970, 48 y.o.   MRN: [id number]  HPI    Review of Systems  Constitutional: Negative for fever and unexpected weight change.  HENT: Negative for congestion, dental problem, ear pain, nosebleeds, postnasal drip, rhinorrhea, sinus pressure, sneezing, sore throat and trouble swallowing.   Eyes: Negative for redness and itching.  Respiratory: Positive for cough and shortness of breath. Negative for chest tightness and wheezing.   Cardiovascular: Positive for chest pain. Negative for palpitations and leg swelling.  Gastrointestinal: Negative for nausea and vomiting.  Genitourinary: Negative for dysuria.  Musculoskeletal: Negative for joint swelling.  Skin: Negative for rash.  Allergic/Immunologic: Negative.  Negative for environmental allergies, food allergies and immunocompromised state.  Neurological: Negative for headaches.  Hematological: Does not bruise/bleed easily.  Psychiatric/Behavioral: Negative for dysphoric mood. The patient is not nervous/anxious.        Objective:   Physical Exam        Assessment & Plan:

## 2018-03-20 ENCOUNTER — Ambulatory Visit (HOSPITAL_COMMUNITY): Payer: Self-pay | Admitting: Psychiatry

## 2018-03-30 ENCOUNTER — Ambulatory Visit (HOSPITAL_COMMUNITY): Payer: Self-pay | Admitting: Psychiatry

## 2018-04-16 ENCOUNTER — Ambulatory Visit (HOSPITAL_COMMUNITY): Payer: Self-pay | Admitting: Psychiatry

## 2018-10-13 ENCOUNTER — Emergency Department (INDEPENDENT_AMBULATORY_CARE_PROVIDER_SITE_OTHER)
Admission: EM | Admit: 2018-10-13 | Discharge: 2018-10-13 | Disposition: A | Discharge status: Home / Self Care | Payer: BLUE CROSS/BLUE SHIELD | Source: Home / Self Care | Attending: Family Medicine | Admitting: Family Medicine

## 2018-10-13 ENCOUNTER — Encounter: Payer: Self-pay | Admitting: Emergency Medicine

## 2018-10-13 ENCOUNTER — Other Ambulatory Visit: Payer: Self-pay

## 2018-10-13 DIAGNOSIS — F410 Panic disorder [episodic paroxysmal anxiety] without agoraphobia: Secondary | ICD-10-CM

## 2018-10-13 NOTE — ED Triage Notes (Addendum)
Woke up in middle of the night with racing thoughts, took Celexa fell asleep woke up and still felt dizzy and lethargic, fatigued

## 2018-10-13 NOTE — Discharge Instructions (Addendum)
Take melatonin at about 5 or 6 pm.  Try taking hydroxyzine 10mg  thirty minutes before bedtime.

## 2018-10-21 NOTE — ED Provider Notes (Signed)
[facility]    CSN: [id number] Arrival date & time: 10/13/18  1555      History   Chief Complaint Chief Complaint  Patient presents with  . Fatigue    HPI Sean Woods is a 48 y.o. male.   Patient was awakened last night about 2am by racing thoughts (not a nightmare).  He then took a Paxil tab and was able to fall asleep.  This morning he felt weak and tired, and slightly dizzy.  He denies feeling anxious or depressed at present. Earlier today he visited a [facility] urgent care facility where a COVID19 test was negative, and he was prescribed hydroxyzine 10mg  to take at bedtime.  The history is provided by the patient.  Anxiety This is a new problem. Episode onset: last night. The problem has been resolved. Pertinent negatives include no headaches. Associated symptoms comments: Heart racing. Nothing aggravates the symptoms. Nothing relieves the symptoms. Treatments tried: Paxil. The treatment provided significant relief.    Past Medical History:  Diagnosis Date  . Hypertension    borderline; no meds  . Vertigo     Patient Active Problem List   Diagnosis Date Noted  . Chest pain 02/02/2018  . OSA on CPAP 02/02/2018  . Lumbar degenerative disc disease 03/14/2016    History reviewed. No pertinent surgical history.     Home Medications    Prior to Admission medications   Not on File    Family History Family History  Problem Relation Age of Onset  . Hypertension Mother   . Hypertension Sister     Social History Social History   Tobacco Use  . Smoking status: Never Smoker  . Smokeless tobacco: Never Used  Substance Use Topics  . Alcohol use: Yes  . Drug use: No     Allergies   Patient has no known allergies.   Review of Systems Review of Systems  Neurological: Negative for headaches.  Psychiatric/Behavioral: Positive for sleep disturbance. Negative for confusion, decreased concentration, dysphoric mood, hallucinations and suicidal  ideas. The patient is nervous/anxious and is hyperactive.   All other systems reviewed and are negative.    Physical Exam Triage Vital Signs ED Triage Vitals  Enc Vitals Group     BP 10/13/18 1609 129/88     Pulse Rate 10/13/18 1609 72     Resp --      Temp 10/13/18 1609 98.8 F (37.1 C)     Temp Source 10/13/18 1609 Oral     SpO2 10/13/18 1609 98 %     Weight 10/13/18 1610 180 lb (81.6 kg)     Height 10/13/18 1610 5\' 9"  (1.753 m)     Head Circumference --      Peak Flow --      Pain Score 10/13/18 1610 0     Pain Loc --      Pain Edu? --      Excl. in GC? --    No data found.  Updated Vital Signs BP 129/88 (BP Location: Right Arm)   Pulse 72   Temp 98.8 F (37.1 C) (Oral)   Ht 5\' 9"  (1.753 m)   Wt 81.6 kg   SpO2 98%   BMI 26.58 kg/m   Visual Acuity Right Eye Distance:   Left Eye Distance:   Bilateral Distance:    Right Eye Near:   Left Eye Near:    Bilateral Near:     Physical Exam Nursing notes and Vital Signs reviewed. Appearance:  Patient appears stated age, and in no acute distress.   Psychiatric:  Patient is alert and oriented with good eye contact.  Thoughts are organized.  No psychomotor retardation.  Memory intact.  Not suicidal.  Euthymic mood and affect.  Eyes:  Pupils are equal, round, and reactive to light and accomodation.  Extraocular movement is intact.  Conjunctivae are not inflamed   Pharynx:  Normal; moist mucous membranes  Neck:  Supple.  No adenopathy Lungs:  Clear to auscultation.  Breath sounds are equal.  Moving air well. Heart:  Regular rate and rhythm without murmurs, rubs, or gallops.  Abdomen:  Nontender without masses or hepatosplenomegaly.  Bowel sounds are present.  No CVA or flank tenderness.  Extremities:  No edema.  Skin:  No rash present.     UC Treatments / Results  Labs (all labs ordered are listed, but only abnormal results are displayed) Labs Reviewed - No data to display  EKG   Radiology No results found.   Procedures Procedures (including critical care time)  Medications Ordered in UC Medications - No data to display  Initial Impression / Assessment and Plan / UC Course  I have reviewed the triage vital signs and the nursing notes.  Pertinent labs & imaging results that were available during my care of the patient were reviewed by me and considered in my medical decision making (see chart for details).    Recommend follow-up with PCP in about one week, especially if symptoms not improved.  Final Clinical Impressions(s) / UC Diagnoses   Final diagnoses:  Anxiety attack     Discharge Instructions     Take melatonin at about 5 or 6 pm.  Try taking hydroxyzine 10mg  thirty minutes before bedtime.   ED Prescriptions    None        Kandra Nicolas, MD 10/21/18 1505

## 2019-03-13 ENCOUNTER — Ambulatory Visit: Payer: Self-pay | Attending: Internal Medicine

## 2019-03-13 DIAGNOSIS — Z23 Encounter for immunization: Secondary | ICD-10-CM | POA: Insufficient documentation

## 2019-03-13 NOTE — Progress Notes (Signed)
   Covid-19 Vaccination Clinic  Name:  Sean Woods    MRN: [id number] DOB: Apr 03, 1970  03/13/2019  Sean Woods was observed post Covid-19 immunization for 15 minutes without incident. He was provided with Vaccine Information Sheet and instruction to access the V-Safe system.   Sean Woods was instructed to call 911 with any severe reactions post vaccine: Marland Kitchen Difficulty breathing  . Swelling of face and throat  . A fast heartbeat  . A bad rash all over body  . Dizziness and weakness   Immunizations Administered    Name Date Dose VIS Date Route   Pfizer COVID-19 Vaccine 03/13/2019 11:15 AM 0.3 mL 12/18/2018 Intramuscular   Manufacturer: [employer], [employer]   Lot: [id number]   NDC: [address]-4

## 2019-04-03 ENCOUNTER — Ambulatory Visit: Payer: Self-pay | Attending: Internal Medicine

## 2019-04-03 DIAGNOSIS — Z23 Encounter for immunization: Secondary | ICD-10-CM

## 2019-04-03 NOTE — Progress Notes (Signed)
   Covid-19 Vaccination Clinic  Name:  Sean Woods    MRN: [id number] DOB: [birth year]  04/03/2019  [name] was observed post Covid-19 immunization for 15 minutes without incident. He was provided with Vaccine Information Sheet and instruction to access the V-Safe system.   [name] was instructed to call 911 with any severe reactions post vaccine: Marland Kitchen Difficulty breathing  . Swelling of face and throat  . A fast heartbeat  . A bad rash all over body  . Dizziness and weakness   Immunizations Administered    Name Date Dose VIS Date Route   Pfizer COVID-19 Vaccine 04/03/2019 11:12 AM 0.3 mL 12/18/2018 Intramuscular   Manufacturer: [employer], [employer]   Lot: [id number]   NDC: [address]-9

## 2019-04-13 ENCOUNTER — Ambulatory Visit: Payer: Self-pay

## 2019-07-27 ENCOUNTER — Ambulatory Visit (INDEPENDENT_AMBULATORY_CARE_PROVIDER_SITE_OTHER): Payer: 59 | Admitting: Licensed Clinical Social Worker

## 2019-07-27 ENCOUNTER — Other Ambulatory Visit: Payer: Self-pay

## 2019-07-27 DIAGNOSIS — F4321 Adjustment disorder with depressed mood: Secondary | ICD-10-CM

## 2019-07-27 DIAGNOSIS — Z630 Problems in relationship with spouse or partner: Secondary | ICD-10-CM | POA: Diagnosis not present
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
## 2019-08-30 ENCOUNTER — Ambulatory Visit (INDEPENDENT_AMBULATORY_CARE_PROVIDER_SITE_OTHER): Payer: 59 | Admitting: Licensed Clinical Social Worker

## 2019-08-30 DIAGNOSIS — F4321 Adjustment disorder with depressed mood: Secondary | ICD-10-CM

## 2019-08-31 NOTE — Progress Notes (Signed)
Virtual Visit via Video Note  I connected with Sean Woods on 08/31/19 at 11:00 AM EDT by a video enabled telemedicine application and verified that I am speaking with the correct person using two identifiers.  Location: Patient: work Provider: Office   I discussed the limitations of evaluation and management by telemedicine and the availability of in person appointments. The patient expressed understanding and agreed to proceed.  THERAPIST PROGRESS NOTE  Session Time: 11:00 am -11:45 am  Participation Level: Active  Behavioral Response: CasualAlertDepressed  Type of Therapy: Individual Therapy  Treatment Goals addressed: Coping  Interventions: CBT and Solution Focused  Case Summary: Sean Woods is a 49 y.o. male who presents oriented x5 (person, place, situation, time, and object), causally dressed, appropriately groomed, average height, average weight, and cooperative to address mood. Patient has a minimal history of medical treatment including degenerative disc disease and hypertension. Patient has minimal history of mental health treatment. Patient denies suicidal and homicidal ideations. Patient denies psychosis including auditory and visual hallucinations. Patient denies substance abuse. Patient is at low risk for lethality at this time.  Session #1  Physically: Patient is doing well physically overall.  Spiritually/values: No issues identified.  Relationships: Patient shared his feelings about his relationship with his mother and his father. Patient feels like something happened between his parents to cause them to separate. He doesn't know what it is and his parents won't talk about it. Patient wants to have a relationship with his father at times but is not sure what happened between his parents. He is not sure if trying to have a relationship with his father would hurt his mother. Patient also feels like while his mother was present in his life, he doesn't have a close  relationship with her. Patient feels like his father has other children and he is close with them but didn't make the effort to see them. He is not sure if this is a flaw from his father or if his mother keep his father from them. Patient hears from his father occasionally. He feels like if he makes the effort that his father may not put the same effort in. Patient is unsure if it is worth the effort. Patient also struggles with his relationship with his daughter. He contacts her regularly. He doesn't feel like she makes a lot of effort to show her feelings for him.  Emotionally/Mentally/Behavior: Patient's mood is ok but depressed at times. He is concerned about his relationships. Patient was able to identify the worst case scenario for calling his father/working on their relationship which was his father may not be truthful and try to insult his mother. His best case scenario would be his father is open with his shares his feelings and apologizes/explains the past. Patient feels like the most likely outcome is that he would have a relationship with his father for a time, his father may not be fully truthful and the relationship may fizzle out. Patient is going to ponder if having a relationship with his father is "worth" the effort. He also knows that at some point he needs to talk to his mother about what happened between her and his father. He also noted that his mother gave him and his sibling a general apology about what happened in childhood/dealing with their father. Patient still doesn't know what the apology was for. It wasn't specific.   Patient engaged in session. He responded well to interventions. Patient continues to meet criteria for Adjustment disorder with depressed mood.  Patient will continue in outpatient therapy due to being the least restrictive service to meet his needs at this time. Patient made minimal progress.   Suicidal/Homicidal: Negativewithout intent/plan  Therapist Response:  Therapist reviewed patient's recent thoughts and behaviors. Therapist utilized CBT to address mood. Therapist processed patient's feelings to identify triggers for mood. Therapist discussed with patient his relationship with his parents and his relationship with his daughter that impact his mood.   Plan: Return again in 1 weeks.  Diagnosis: Axis I: Adjustment Disorder with Depressed Mood    Axis II: No diagnosis  I discussed the assessment and treatment plan with the patient. The patient was provided an opportunity to ask questions and all were answered. The patient agreed with the plan and demonstrated an understanding of the instructions.   The patient was advised to call back or seek an in-person evaluation if the symptoms worsen or if the condition fails to improve as anticipated.  I provided 45 minutes of non-face-to-face time during this encounter.  Bynum Bellows, LCSW 08/31/2019

## 2019-09-06 ENCOUNTER — Ambulatory Visit (INDEPENDENT_AMBULATORY_CARE_PROVIDER_SITE_OTHER): Payer: 59 | Admitting: Licensed Clinical Social Worker

## 2019-09-06 DIAGNOSIS — F4321 Adjustment disorder with depressed mood: Secondary | ICD-10-CM

## 2019-09-06 DIAGNOSIS — Z630 Problems in relationship with spouse or partner: Secondary | ICD-10-CM | POA: Diagnosis not present

## 2019-09-07 NOTE — Progress Notes (Signed)
Virtual Visit via Video Note  I connected with Sean Woods on 09/07/19 at 11:00 AM EDT by a video enabled telemedicine application and verified that I am speaking with the correct person using two identifiers.  Location: Patient: work Provider: Office   I discussed the limitations of evaluation and management by telemedicine and the availability of in person appointments. The patient expressed understanding and agreed to proceed.  THERAPIST PROGRESS NOTE  Session Time: 11:20 am -11:45 am  Participation Level: Active  Behavioral Response: CasualAlertDepressed  Type of Therapy: Individual Therapy  Treatment Goals addressed: Coping  Interventions: CBT and Solution Focused  Case Summary: Sean Woods is a 49 y.o. male who presents oriented x5 (person, place, situation, time, and object), causally dressed, appropriately groomed, average height, average weight, and cooperative to address mood. Patient has a minimal history of medical treatment including degenerative disc disease and hypertension. Patient has minimal history of mental health treatment. Patient denies suicidal and homicidal ideations. Patient denies psychosis including auditory and visual hallucinations. Patient denies substance abuse. Patient is at low risk for lethality at this time.  Session #2  Physically: Patient is doing well physically overall.  Spiritually/values: No issues identified.  Relationships: Patient discussed his relationship with his wife. He feels like because of mistakes he has made in the past (5 years ago), his wife will bring up his mistakes during arguments which makes it hard to move forward. Patient understood that she brings this up as "ammunition" to "win" the argument. Patient is trying to prevent unnecessary arguments with his wife. Patient also agreed to work on keeping arguments on the issue at hand and try to reel his wife back in if needed to stay on the main issue. Patient also admitted he  has to stop himself from reacting, bringing up the past, or retaliating. Patient noted his wife tells him that he doesn't think of her or put her first. After discussion, patient agreed to "plan" to do something spontaneous for his wife. Patient is planning on doing something for his wife and surprise her with a small act of kindness, etc. He may plan it but it will come off as spontaneous to her.  Emotionally/Mentally/Behavior: Patient's mood has been stable. He reached out to his daughter just to tell her that he is thinking about her and loved her. She responded back and said she loved him as well. Patient felt good to get that response from her.   Patient engaged in session. He responded well to interventions. Patient continues to meet criteria for Adjustment disorder with depressed mood. Patient will continue in outpatient therapy due to being the least restrictive service to meet his needs at this time. Patient made minimal progress.   Suicidal/Homicidal: Negativewithout intent/plan  Therapist Response: Therapist reviewed patient's recent thoughts and behaviors. Therapist utilized CBT to address mood. Therapist processed patient's feelings to identify triggers for mood. Therapist discussed with patient his relationship with his wife and steps he can take to argue "better" and improve the relationships.   Plan: Return again in 1 weeks.  Diagnosis: Axis I: Adjustment Disorder with Depressed Mood    Axis II: No diagnosis  I discussed the assessment and treatment plan with the patient. The patient was provided an opportunity to ask questions and all were answered. The patient agreed with the plan and demonstrated an understanding of the instructions.   The patient was advised to call back or seek an in-person evaluation if the symptoms worsen or if the condition fails  to improve as anticipated.  I provided 30 minutes of non-face-to-face time during this encounter.  Bynum Bellows,  LCSW 09/07/2019

## 2019-09-14 ENCOUNTER — Ambulatory Visit (HOSPITAL_COMMUNITY): Payer: 59 | Admitting: Licensed Clinical Social Worker

## 2019-09-21 ENCOUNTER — Ambulatory Visit (HOSPITAL_COMMUNITY): Payer: 59 | Admitting: Licensed Clinical Social Worker

## 2020-06-04 IMAGING — DX DG CHEST 2V
2 series · 2 of 2 positions shown · non-contrast
Comparison: None.

CLINICAL DATA: Left side chest pain, shortness of breath

EXAM:
CHEST - 2 VIEW

[chest pa]
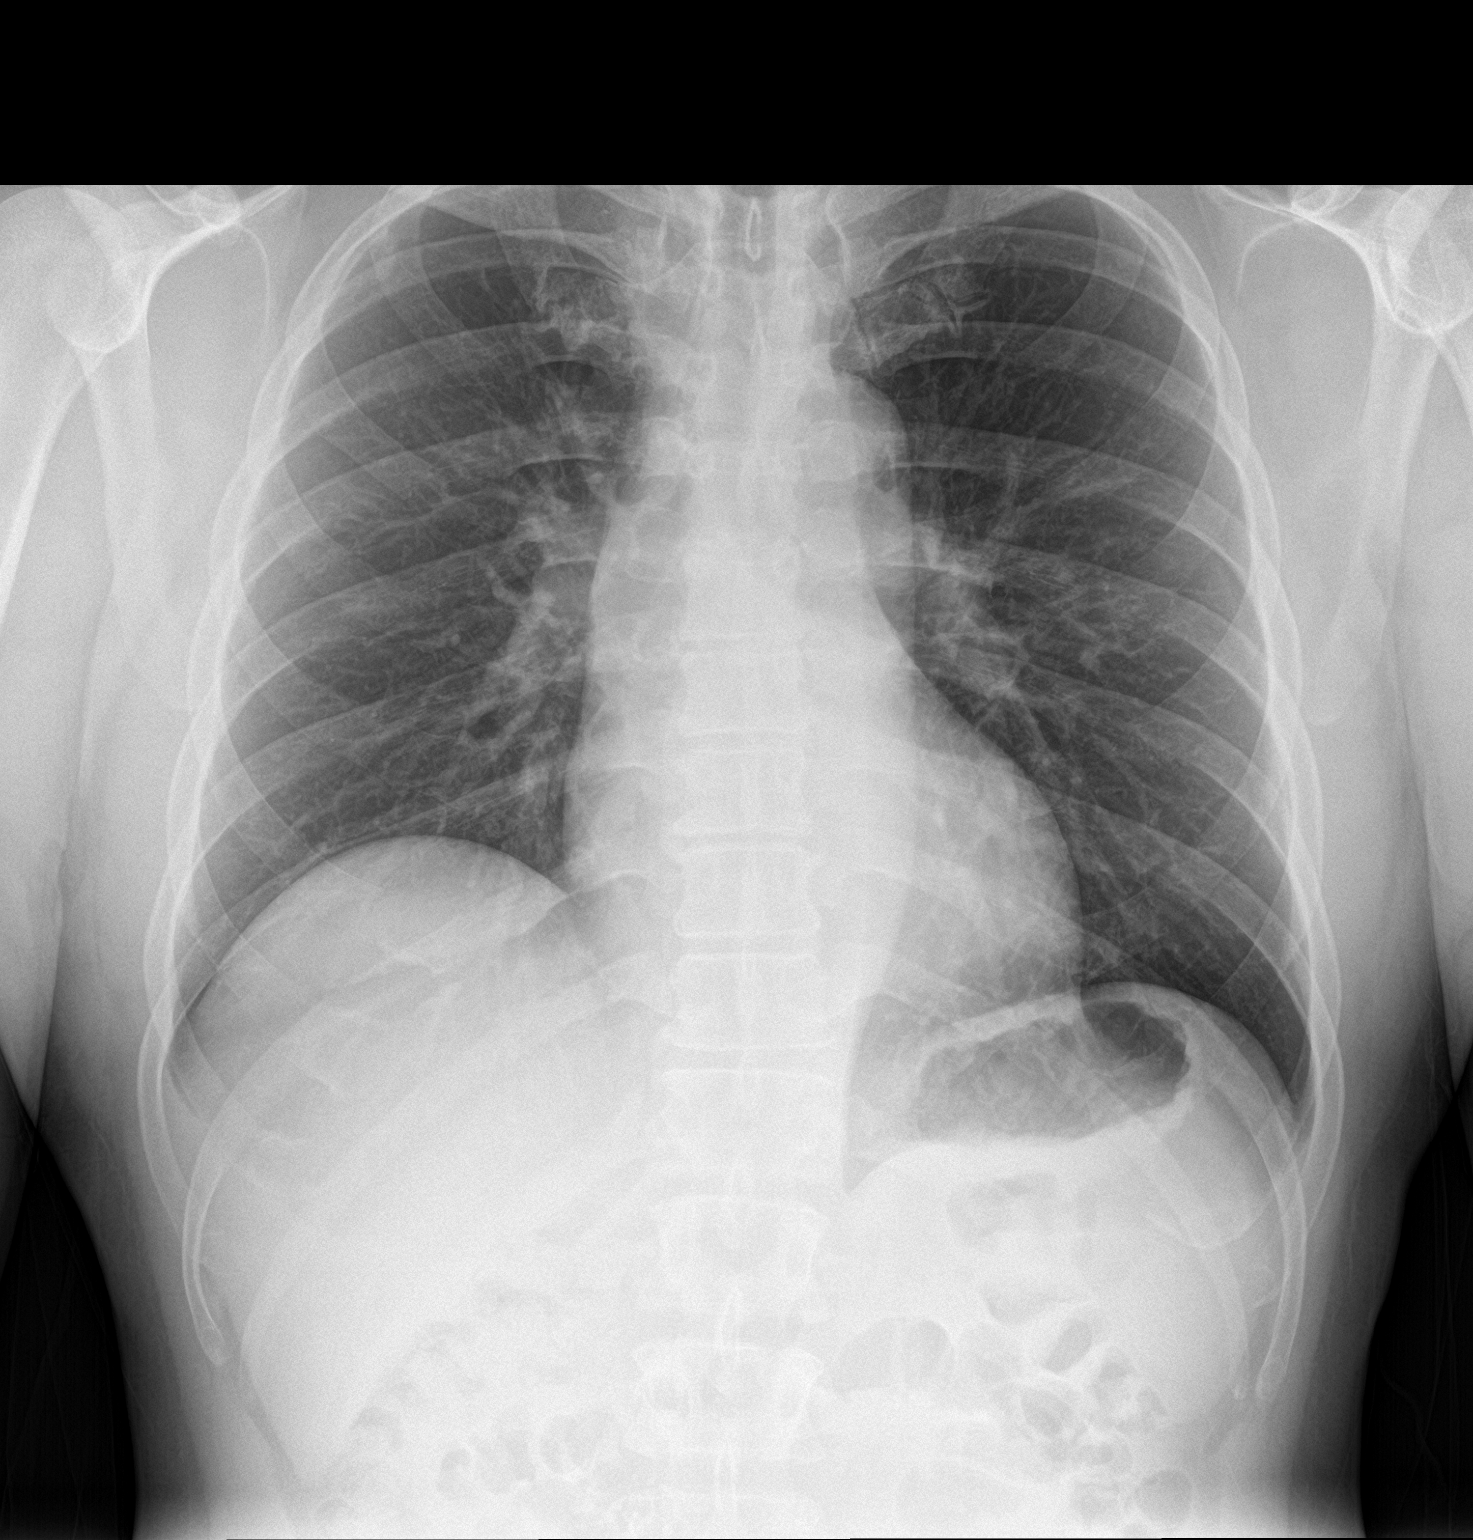

[chest lat]
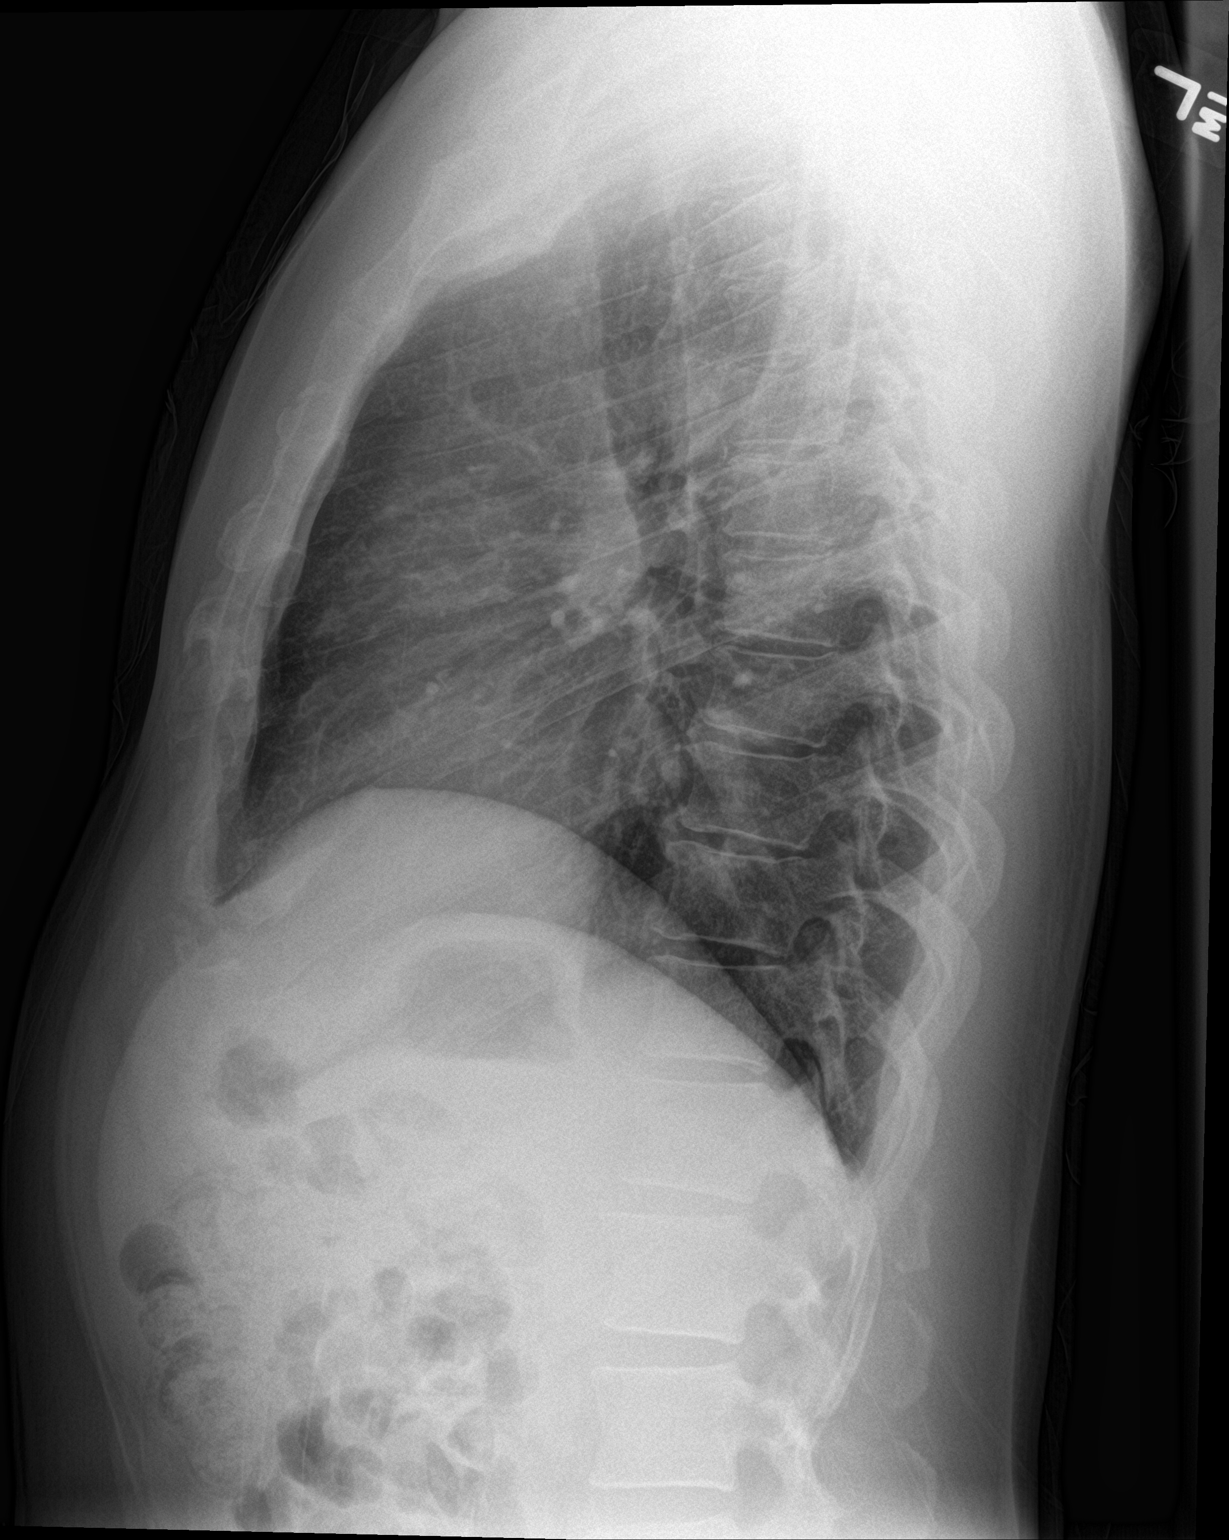

[2 of 2 positions shown; findings below may reference images not displayed]

FINDINGS: Heart and mediastinal contours are within normal limits. No focal
opacities or effusions. No acute bony abnormality.
IMPRESSION: No active cardiopulmonary disease.
